# Patient Record
Sex: Female | Born: 1990 | Race: White | Hispanic: No | Marital: Single | Smoking: Never smoker
Health system: Southern US, Community
[De-identification: ages and names within clinical notes are randomized; demographics above are authoritative.]

## PROBLEM LIST (undated history)

## (undated) HISTORY — PX: APPENDECTOMY: SHX54

---

## 2019-08-31 DIAGNOSIS — M6282 Rhabdomyolysis: Secondary | ICD-10-CM

## 2019-08-31 HISTORY — DX: Rhabdomyolysis: M62.82

## 2019-09-09 ENCOUNTER — Encounter (HOSPITAL_COMMUNITY): Payer: Self-pay

## 2019-09-09 ENCOUNTER — Emergency Department (HOSPITAL_COMMUNITY)
Admission: EM | Admit: 2019-09-09 | Discharge: 2019-09-09 | Disposition: A | Discharge status: Home / Self Care | Payer: 59 | Attending: Emergency Medicine | Admitting: Emergency Medicine

## 2019-09-09 ENCOUNTER — Other Ambulatory Visit: Payer: Self-pay

## 2019-09-09 DIAGNOSIS — Z5321 Procedure and treatment not carried out due to patient leaving prior to being seen by health care provider: Secondary | ICD-10-CM | POA: Diagnosis not present

## 2019-09-09 DIAGNOSIS — R2231 Localized swelling, mass and lump, right upper limb: Secondary | ICD-10-CM | POA: Diagnosis present

## 2019-09-09 NOTE — ED Notes (Signed)
Pt returned stickers and eloped from the waiting room prior to [place] completion. VS stable and pt displays no sx of distress.

## 2019-09-09 NOTE — ED Triage Notes (Signed)
Right arm swelling for approx 12 hours. [facility] and came here to rule out blood clot. Swollen, red, and ward to touch. Pt denies pain.

## 2019-09-10 ENCOUNTER — Encounter (HOSPITAL_COMMUNITY): Payer: Self-pay | Admitting: Emergency Medicine

## 2019-09-10 ENCOUNTER — Observation Stay (HOSPITAL_COMMUNITY)
Admission: EM | Admit: 2019-09-10 | Discharge: 2019-09-11 | Disposition: A | Discharge status: Home / Self Care | Payer: 59 | Attending: Internal Medicine | Admitting: Internal Medicine

## 2019-09-10 ENCOUNTER — Emergency Department (HOSPITAL_COMMUNITY)
Admit: 2019-09-10 | Discharge: 2019-09-10 | Disposition: A | Discharge status: Home / Self Care | Payer: 59 | Attending: Emergency Medicine | Admitting: Emergency Medicine

## 2019-09-10 ENCOUNTER — Emergency Department (HOSPITAL_COMMUNITY): Payer: 59

## 2019-09-10 DIAGNOSIS — R609 Edema, unspecified: Secondary | ICD-10-CM

## 2019-09-10 DIAGNOSIS — M6282 Rhabdomyolysis: Secondary | ICD-10-CM | POA: Diagnosis not present

## 2019-09-10 DIAGNOSIS — R7401 Elevation of levels of liver transaminase levels: Secondary | ICD-10-CM | POA: Diagnosis not present

## 2019-09-10 DIAGNOSIS — Z20822 Contact with and (suspected) exposure to covid-19: Secondary | ICD-10-CM | POA: Diagnosis not present

## 2019-09-10 LAB — CBC WITH DIFFERENTIAL/PLATELET
Abs Immature Granulocytes: 0.02 10*3/uL (ref 0.00–0.07)
Basophils Absolute: 0 10*3/uL (ref 0.0–0.1)
Basophils Relative: 1 %
Eosinophils Absolute: 0.1 10*3/uL (ref 0.0–0.5)
Eosinophils Relative: 2 %
HCT: 41 % (ref 36.0–46.0)
Hemoglobin: 13 g/dL (ref 12.0–15.0)
Immature Granulocytes: 0 %
Lymphocytes Relative: 19 %
Lymphs Abs: 1.3 10*3/uL (ref 0.7–4.0)
MCH: 30 pg (ref 26.0–34.0)
MCHC: 31.7 g/dL (ref 30.0–36.0)
MCV: 94.5 fL (ref 80.0–100.0)
Monocytes Absolute: 0.4 10*3/uL (ref 0.1–1.0)
Monocytes Relative: 6 %
Neutro Abs: 4.7 10*3/uL (ref 1.7–7.7)
Neutrophils Relative %: 72 %
Platelets: 236 10*3/uL (ref 150–400)
RBC: 4.34 MIL/uL (ref 3.87–5.11)
RDW: 13.3 % (ref 11.5–15.5)
WBC: 6.6 10*3/uL (ref 4.0–10.5)
nRBC: 0 % (ref 0.0–0.2)

## 2019-09-10 LAB — I-STAT BETA HCG BLOOD, ED (NOT ORDERABLE): I-stat hCG, quantitative: <5 m[IU]/mL (ref <5)

## 2019-09-10 LAB — HIV ANTIBODY (ROUTINE TESTING W REFLEX): HIV Screen 4th Generation wRfx: NONREACTIVE

## 2019-09-10 LAB — COMPREHENSIVE METABOLIC PANEL
ALT: 96 U/L — ABNORMAL HIGH (ref 0–44)
AST: 172 U/L — ABNORMAL HIGH (ref 15–41)
Albumin: 3.3 g/dL — ABNORMAL LOW (ref 3.5–5.0)
Alkaline Phosphatase: 53 U/L (ref 38–126)
Anion gap: 10 (ref 5–15)
BUN: 11 mg/dL (ref 6–20)
CO2: 23 mmol/L (ref 22–32)
Calcium: 8.8 mg/dL — ABNORMAL LOW (ref 8.9–10.3)
Chloride: 107 mmol/L (ref 98–111)
Creatinine, Ser: 0.7 mg/dL (ref 0.44–1.00)
GFR calc Af Amer: >60 mL/min (ref >60)
GFR calc non Af Amer: >60 mL/min (ref >60)
Glucose, Bld: 94 mg/dL (ref 70–99)
Potassium: 3.7 mmol/L (ref 3.5–5.1)
Sodium: 140 mmol/L (ref 135–145)
Total Bilirubin: 0.7 mg/dL (ref 0.3–1.2)
Total Protein: 5.9 g/dL — ABNORMAL LOW (ref 6.5–8.1)

## 2019-09-10 LAB — SARS CORONAVIRUS 2 BY RT PCR (HOSPITAL ORDER, PERFORMED IN ~~LOC~~ HOSPITAL LAB): SARS Coronavirus 2: NEGATIVE

## 2019-09-10 LAB — CK: Total CK: 6125 U/L — ABNORMAL HIGH (ref 38–234)

## 2019-09-10 MED ORDER — SODIUM CHLORIDE 0.9 % IV BOLUS
1000.0000 mL | Freq: Once | INTRAVENOUS | Status: AC
  Administered 2019-09-10: 1000 mL via INTRAVENOUS

## 2019-09-10 MED ORDER — IOHEXOL 300 MG/ML  SOLN
100.0000 mL | Freq: Once | INTRAMUSCULAR | Status: AC | PRN
  Administered 2019-09-10: 100 mL via INTRAVENOUS

## 2019-09-10 MED ORDER — SODIUM CHLORIDE 0.9 % IV SOLN: INTRAVENOUS | Status: DC

## 2019-09-10 MED ORDER — ACETAMINOPHEN 325 MG PO TABS
650.0000 mg | ORAL_TABLET | Freq: Four times a day (QID) | ORAL | Status: DC | PRN
  Administered 2019-09-10: 650 mg via ORAL
  Filled 2019-09-10: qty 2

## 2019-09-10 MED ORDER — ACETAMINOPHEN 650 MG RE SUPP: 650.0000 mg | Freq: Four times a day (QID) | RECTAL | Status: DC | PRN

## 2019-09-10 NOTE — ED Triage Notes (Addendum)
Pt states she had a sudden onset of right arm swelling that began yesterday- begins in right upper arm into forearm. Pain is currently just a 1/10. +2 radial pulse. Pt states she did do an intense arm work out 1 week ago and has soreness in her arms for a few days.

## 2019-09-10 NOTE — Progress Notes (Signed)
Right upper extremity venous duplex completed. Refer to "CV Proc" under chart review to view preliminary results.  09/10/2019 8:27 AM Eula Fried., MHA, RVT, RDCS, RDMS

## 2019-09-10 NOTE — Plan of Care (Signed)

## 2019-09-10 NOTE — ED Provider Notes (Signed)
Medical screening examination/treatment/procedure(s) were conducted as a shared visit with non-physician practitioner(s) and myself.  I personally evaluated the patient during the encounter. Briefly, the patient is a 29 y.o. female with no significant medical history who presents to the ED with right arm swelling that began yesterday.  Overall unremarkable vitals.  Patient states no specific trauma.  She did do an intense arm workout about a week ago and had soreness in her arms for a few days.  Swelling started yesterday and has slowly progressed.  Denies any numbness tingling, severe pain or weakness.  She has good pulses in her right upper extremity.  Overall the compartments of the arm feels soft but there is some fullness in the forearm and bicep area and AC area.  There is some mild erythema that traces from the mid bicep down to the wrist.  No obvious crepitus.  Swelling does not go into the axillary area.  No swelling of the neck of the face.  Patient denies any IV drug use, alcohol use socially.  DVT study thus far has been performed that was negative.  Does not really appear consistent with cellulitis.  Does not appear to be an arterial process given good capillary refill, good neurovascular exam and good pulses in the right radial.  Will pursue lab work including CK.  Will get CT imaging of the chest and right upper extremity to further evaluate for soft tissue process or mass-effect.  CK elevated.  Liver enzymes mildly elevated.  CT imaging is pending but anticipate admission for further rhabdomyolysis care.  Compartments are soft at this time.  Will touch base with orthopedics to make them aware.  This chart was dictated using voice recognition software.  Despite best efforts to proofread,  errors can occur which can change the documentation meaning.     EKG Interpretation None           Virgina Norfolk, DO 09/10/19 1316

## 2019-09-10 NOTE — ED Provider Notes (Signed)
[facility] EMERGENCY DEPARTMENT Provider Note   CSN: [id number] Arrival date & time: 09/10/19  0932     History Chief Complaint  Patient presents with  . Edema    Evelyn Hill is a 29 y.o. female who presents emergency department chief complaint of right upper extremity swelling.  Patient states that she woke up yesterday with some swelling in the right arm that has progressively worsened.  She states that she tried to wait it out yesterday eventually went to an urgent care who sent her to the ER however the wait was long and she left prior to having an ultrasound of the right upper extremity.  Since then her arm has gotten progressively more swollen.  She states that it is somewhat sore but not extremely painful.  It feels tight, heavy and uncomfortable.  She has never had anything like this before.  She recently began working out and did a fairly intense arm workout on Monday.  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  She is right-hand dominant.  She denies cough, chest pain, shortness of breath.  She is not a smoker. She does not take OCPs. No injuries. No change in urine color. No bug bites. No rashes.    HPI     History reviewed. No pertinent past medical history.  Patient Active Problem List   Diagnosis Date Noted  . Rhabdomyolysis 09/10/2019    History reviewed. No pertinent surgical history.   OB History   No obstetric history on file.     No family history on file.  Social History   Tobacco Use  . Smoking status: Never Smoker  . Smokeless tobacco: Never Used  Substance Use Topics  . Alcohol use: Yes  . Drug use: Not Currently    Home Medications Prior to Admission medications   Medication Sig Start Date End Date Taking? Authorizing Provider  ibuprofen (ADVIL) 200 MG tablet Take 400 mg by mouth every 6 (six) hours as needed for headache.   Yes  [provider]    Allergies    Patient has no known allergies.  Review of Systems   Review of Systems Ten systems reviewed and are negative for acute change, except as noted in the HPI.   Physical Exam Updated Vital Signs BP 135/70 (BP Location: Right Arm)   Pulse 95   Temp 98.5 F (36.9 C) (Oral)   Resp 16   SpO2 100%   Physical Exam Vitals and nursing note reviewed.  Constitutional:      General: She is not in acute distress.    Appearance: She is well-developed. She is not diaphoretic.  HENT:     Head: Normocephalic and atraumatic.  Eyes:     General: No scleral icterus.    Conjunctiva/sclera: Conjunctivae normal.  Cardiovascular:     Rate and Rhythm: Normal rate and regular rhythm.     Heart sounds: Normal heart sounds. No murmur. No friction rub. No gallop.   Pulmonary:     Effort: Pulmonary effort is normal. No respiratory distress.     Breath sounds: Normal breath sounds.  Abdominal:     General: Bowel sounds are normal. There is no distension.     Palpations: Abdomen is soft. There is no mass.     Tenderness: There is no abdominal tenderness. There is no guarding.  Musculoskeletal:     Cervical back:  Normal range of motion.     Comments: RUE Is markedly edematous from the fingers up to the axilla. 2+ radial pulse. No telangectasia or venous dilation.  Skin:    General: Skin is warm and dry.  Neurological:     Mental Status: She is alert and oriented to person, place, and time.  Psychiatric:        Behavior: Behavior normal.     ED Results / Procedures / Treatments   Labs (all labs ordered are listed, but only abnormal results are displayed) Labs Reviewed  CK - Abnormal; Notable for the following components:      Result Value   Total CK 6,125 (*)    All other components within normal limits  COMPREHENSIVE METABOLIC PANEL - Abnormal; Notable for the following components:   Calcium 8.8 (*)    Total Protein 5.9 (*)    Albumin 3.3 (*)     AST 172 (*)    ALT 96 (*)    All other components within normal limits  SARS CORONAVIRUS 2 BY RT PCR (HOSPITAL ORDER, [place] [facility])  CBC WITH DIFFERENTIAL/PLATELET  HIV ANTIBODY (ROUTINE TESTING W REFLEX)  URINALYSIS, ROUTINE W REFLEX MICROSCOPIC  I-STAT BETA HCG BLOOD, ED ([facility], [facility], [facility] ONLY)    EKG None  Radiology DG Chest 2 View  Result Date: 09/10/2019 CLINICAL DATA:  Right upper extremity swelling. EXAM: CHEST - 2 VIEW COMPARISON:  None FINDINGS: The heart size and mediastinal contours are within normal limits. Both lungs are clear. The visualized skeletal structures are unremarkable. IMPRESSION: No active cardiopulmonary disease. Electronically Signed   By: Kerby Moors M.D.   On: 09/10/2019 08:38   CT Chest W Contrast  Result Date: 09/10/2019 CLINICAL DATA:  Right arm swelling EXAM: CT CHEST WITH CONTRAST TECHNIQUE: Multidetector CT imaging of the chest was performed during intravenous contrast administration. CONTRAST:  149mL OMNIPAQUE IOHEXOL 300 MG/ML  SOLN COMPARISON:  None. FINDINGS: Cardiovascular: Right subclavian vein and superior vena cava are not well evaluated due to contrast timing. Normal heart size. No pericardial effusion. Normal caliber aorta. Mediastinum/Nodes: No evidence of adenopathy. The visualized thyroid is unremarkable. Esophagus is unremarkable. Lungs/Pleura: No consolidation or mass. No pleural effusion pneumothorax. Upper Abdomen: No acute abnormality. Musculoskeletal: No acute abnormality. Small thoracic spine Schmorl's nodes. IMPRESSION: No acute or significant abnormality. The right subclavian vein and superior vena cava are not well evaluated due to contrast timing. Electronically Signed   By: Macy Mis M.D.   On: 09/10/2019 13:46   UE VENOUS DUPLEX ([facility] & [facility] 7 am - 7 pm)  Result Date: 09/10/2019 UPPER VENOUS STUDY  Indications: Edema, and Recent intense upper extremity exercise. Comparison Study: No prior study Performing  Technologist: Maudry Mayhew MHA, RDMS, RVT, RDCS  Examination Guidelines: A complete evaluation includes B-mode imaging, spectral Doppler, color Doppler, and power Doppler as needed of all accessible portions of each vessel. Bilateral testing is considered an integral part of a complete examination. Limited examinations for reoccurring indications may be performed as noted.  Right Findings: +----------+------------+---------+-----------+----------+-------+ RIGHT     CompressiblePhasicitySpontaneousPropertiesSummary +----------+------------+---------+-----------+----------+-------+ IJV           Full       Yes       Yes                      +----------+------------+---------+-----------+----------+-------+ Subclavian    Full       Yes       Yes                      +----------+------------+---------+-----------+----------+-------+  Axillary      Full       Yes       Yes                      +----------+------------+---------+-----------+----------+-------+ Brachial      Full       Yes       Yes                      +----------+------------+---------+-----------+----------+-------+ Radial        Full                                          +----------+------------+---------+-----------+----------+-------+ Ulnar         Full                                          +----------+------------+---------+-----------+----------+-------+ Cephalic      Full                                          +----------+------------+---------+-----------+----------+-------+ Basilic       Full                                          +----------+------------+---------+-----------+----------+-------+  Left Findings: +----------+------------+---------+-----------+----------+-------+ LEFT      CompressiblePhasicitySpontaneousPropertiesSummary +----------+------------+---------+-----------+----------+-------+ Subclavian               Yes       Yes                       +----------+------------+---------+-----------+----------+-------+  Summary:  Right: No evidence of deep vein thrombosis in the upper extremity. No evidence of superficial vein thrombosis in the upper extremity.  Left: No evidence of thrombosis in the subclavian.  *See table(s) above for measurements and observations.    Preliminary    CT Extrem Up Entire Arm R W/CM  Result Date: 09/10/2019 CLINICAL DATA:  Right upper extremity swelling. EXAM: CT OF THE UPPER RIGHT EXTREMITY WITH CONTRAST TECHNIQUE: Multidetector CT imaging of the upper right extremity was performed according to the standard protocol following intravenous contrast administration. COMPARISON:  None. CONTRAST:  OMNIPAQUE IOHEXOL 300 MG/ML  SOLN FINDINGS: Bones/Joint/Cartilage No fracture or dislocation. Normal alignment. No joint effusion. No periosteal reaction or bone destruction. Ligaments Ligaments are suboptimally evaluated by CT. Muscles and Tendons Muscles are normal. No intramuscular fluid collection or hematoma. Soft tissue No fluid collection or hematoma. No soft tissue mass. Generalized soft tissue edema in the subcutaneous fat of right arm which may be secondary to lymphedema or cellulitis. IMPRESSION: Generalized soft tissue edema in the subcutaneous fat of the right arm which may be secondary to lymphedema or cellulitis. No focal fluid collection to suggest an abscess. Electronically Signed   By: Elige Ko   On: 09/10/2019 14:39    Procedures Procedures (including critical care time)  Medications Ordered in ED Medications  sodium chloride 0.9 % bolus 1,000 mL (has no administration in time range)  0.9 %  sodium chloride infusion (has no administration  in time range)  acetaminophen (TYLENOL) tablet 650 mg (has no administration in time range)    Or  acetaminophen (TYLENOL) suppository 650 mg (has no administration in time range)  iohexol (OMNIPAQUE) 300 MG/ML solution 100 mL (100 mLs Intravenous Contrast  Given 09/10/19 1308)    ED Course  I have reviewed the triage vital signs and the nursing notes.  Pertinent labs & imaging results that were available during my care of the patient were reviewed by me and considered in my medical decision making (see chart for details).  Clinical Course as of Sep 09 1513  Tue Sep 10, 2019  4825 Patient's upper extremity duplex is negative.  Doubt arterial issue as the patient has bounding 2+ pulses without weakness, paresthesia or pain.  Cap refill less than 2 seconds. We will proceed with CT chest and right upper extremity CT to rule out soft tissue infection versus compressive disorder in the chest.   [AH]  1052 ALT(!): 96 [AH]  1053 AST(!): 172 [AH]  1053 Albumin(!): 3.3 [AH]    Clinical Course User Index [AH] Algenis Ballin, PA-C   MDM Rules/Calculators/A&P                      8:08 AM BP 135/70 (BP Location: Right Arm)   Pulse 95   Resp 16   SpO2 100%  Patient with RUE UL swelling.  DDX includes RUE DVT (padget-schroeder/ effort thrombosis, Thoracic outlet syndrome)  Rhabdomyolysis, lymph edema,  Axillary adenopathy, mediastinal mass).  Subclavian steal syndrome   29 year old female who presents the emergency department with chief complaint of right upper extremity swelling.  Differential remains the same.  I personally ordered interpreted reviewed patient's labs which shows a CMP with elevated liver enzymes, mildly low calcium of insignificant value.  Urine pending.  Covid test pending.  CBC without abnormality.  CK elevated at 6125. I consulted with Dr. Susa Simmonds of orthopedics.  He states that he has not heard of a delayed type of rhabdomyolysis however she does appear to have this.  She does not have any evidence of compartment syndrome.  She has normal pulses and sensation.  The patient will be admitted to the hospitalist service for fluids.  I personally reviewed the patient's CT chest which shows no evidence of mass, or SVC syndrome.  There  is no evidence of DVT on the ultrasound.  CT chest is pending.  Patient stable throughout her visit in the emergency department.   Final Clinical Impression(s) / ED Diagnoses Final diagnoses:  Non-traumatic rhabdomyolysis    Rx / DC Orders ED Discharge Orders    None       Arthor Captain, PA-C 09/10/19 1515    Virgina Norfolk, DO 09/13/19 1631

## 2019-09-10 NOTE — ED Notes (Signed)
MS  Dinner ordered 

## 2019-09-10 NOTE — H&P (Signed)
History and Physical    ZO LOUDON XTG:[id number] DOB: November 23, 1990 DOA: 09/10/2019  PCP: Patient, No Pcp Per   Patient coming from: Home  I have personally briefly reviewed patient's old medical records in [facility]  Chief Complaint:Right arm pain, swelling  HPI: Evelyn Hill is a 29 y.o. female with no significant medical history presented with right arm pain and swelling.  Patient went to team to of arm strengths work-up strenuously weightlifting for problem lower 7 days ago.  She went home and next day she started to feel right arm pain and soreness, and over the 7 days she continued to feel the arm pain and heaviness, and yesterday she noticed significant sudden onset of right upper arm swelling to the elbow.  Having trouble to flex the elbow and very heavy feeling on the right upper arm 2.  Patient denied any fever or chills, no numbness, no weakness of the right hand or fingers. ED Course: CK elevation to 6000, right upper arm DVT study negative, CT angiogram negative for PE.  Review of Systems: As per HPI otherwise 10 point review of systems negative.    History reviewed. No pertinent past medical history.  History reviewed. No pertinent surgical history.   reports that she has never smoked. She has never used smokeless tobacco. She reports current alcohol use. She reports previous drug use.  No Known Allergies  No family history on file.    Prior to Admission medications   Medication Sig Start Date End Date Taking? Authorizing Provider  ibuprofen (ADVIL) 200 MG tablet Take 400 mg by mouth every 6 (six) hours as needed for headache.   Yes [provider]    Physical Exam: Vitals:   09/10/19 0633 09/10/19 1500  BP: 135/70   Pulse: 95   Resp: 16   Temp:  98.5 F (36.9 C)  TempSrc:  Oral  SpO2: 100%     Constitutional: NAD, calm, comfortable Vitals:   09/10/19 0633 09/10/19 1500  BP: 135/70   Pulse: 95   Resp: 16   Temp:  98.5  F (36.9 C)  TempSrc:  Oral  SpO2: 100%    Eyes: PERRL, lids and conjunctivae normal ENMT: Mucous membranes are moist. Posterior pharynx clear of any exudate or lesions.Normal dentition.  Neck: normal, supple, no masses, no thyromegaly Respiratory: clear to auscultation bilaterally, no wheezing, no crackles. Normal respiratory effort. No accessory muscle use.  Cardiovascular: Regular rate and rhythm, no murmurs / rubs / gallops. No extremity edema. 2+ pedal pulses. No carotid bruits.  Bilateral radial pulses equal, capillary refill within normal limits Abdomen: no tenderness, no masses palpated. No hepatosplenomegaly. Bowel sounds positive.  Musculoskeletal: no clubbing / cyanosis. No joint deformity upper and lower extremities. Good ROM, no contractures. Normal muscle tone.  Significant swelling over the right upper arm, mild tenderness. Skin: no rashes, lesions, ulcers. No induration Neurologic: CN 2-12 grossly intact. Sensation intact, DTR normal. Strength 5/5 in all 4.  Psychiatric: Normal judgment and insight. Alert and oriented x 3. Normal mood.     Labs on Admission: I have personally reviewed following labs and imaging studies  CBC: Recent Labs  Lab 09/10/19 0911  WBC 6.6  NEUTROABS 4.7  HGB 13.0  HCT 41.0  MCV 94.5  PLT 236   Basic Metabolic Panel: Recent Labs  Lab 09/10/19 0911  NA 140  K 3.7  CL 107  CO2 23  GLUCOSE 94  BUN 11  CREATININE 0.70  CALCIUM 8.8*  GFR: Estimated Creatinine Clearance: 101.8 mL/min (by C-G formula based on SCr of 0.7 mg/dL). Liver Function Tests: Recent Labs  Lab 09/10/19 0911  AST 172*  ALT 96*  ALKPHOS 53  BILITOT 0.7  PROT 5.9*  ALBUMIN 3.3*   No results for input(s): LIPASE, AMYLASE in the last 168 hours. No results for input(s): AMMONIA in the last 168 hours. Coagulation Profile: No results for input(s): INR, PROTIME in the last 168 hours. Cardiac Enzymes: Recent Labs  Lab 09/10/19 0911  CKTOTAL 6,125*   BNP  (last 3 results) No results for input(s): PROBNP in the last 8760 hours. HbA1C: No results for input(s): HGBA1C in the last 72 hours. CBG: No results for input(s): GLUCAP in the last 168 hours. Lipid Profile: No results for input(s): CHOL, HDL, LDLCALC, TRIG, CHOLHDL, LDLDIRECT in the last 72 hours. Thyroid Function Tests: No results for input(s): TSH, T4TOTAL, FREET4, T3FREE, THYROIDAB in the last 72 hours. Anemia Panel: No results for input(s): VITAMINB12, FOLATE, FERRITIN, TIBC, IRON, RETICCTPCT in the last 72 hours. Urine analysis: No results found for: COLORURINE, APPEARANCEUR, LABSPEC, PHURINE, GLUCOSEU, HGBUR, BILIRUBINUR, KETONESUR, PROTEINUR, UROBILINOGEN, NITRITE, LEUKOCYTESUR  Radiological Exams on Admission: DG Chest 2 View  Result Date: 09/10/2019 CLINICAL DATA:  Right upper extremity swelling. EXAM: CHEST - 2 VIEW COMPARISON:  None FINDINGS: The heart size and mediastinal contours are within normal limits. Both lungs are clear. The visualized skeletal structures are unremarkable. IMPRESSION: No active cardiopulmonary disease. Electronically Signed   By: Signa Kell M.D.   On: 09/10/2019 08:38   CT Chest W Contrast  Result Date: 09/10/2019 CLINICAL DATA:  Right arm swelling EXAM: CT CHEST WITH CONTRAST TECHNIQUE: Multidetector CT imaging of the chest was performed during intravenous contrast administration. CONTRAST:  OMNIPAQUE IOHEXOL 300 MG/ML  SOLN COMPARISON:  None. FINDINGS: Cardiovascular: Right subclavian vein and superior vena cava are not well evaluated due to contrast timing. Normal heart size. No pericardial effusion. Normal caliber aorta. Mediastinum/Nodes: No evidence of adenopathy. The visualized thyroid is unremarkable. Esophagus is unremarkable. Lungs/Pleura: No consolidation or mass. No pleural effusion pneumothorax. Upper Abdomen: No acute abnormality. Musculoskeletal: No acute abnormality. Small thoracic spine Schmorl's nodes. IMPRESSION: No acute or  significant abnormality. The right subclavian vein and superior vena cava are not well evaluated due to contrast timing. Electronically Signed   By: Guadlupe Spanish M.D.   On: 09/10/2019 13:46   UE VENOUS DUPLEX (MC & WL 7 am - 7 pm)  Result Date: 09/10/2019 UPPER VENOUS STUDY  Indications: Edema, and Recent intense upper extremity exercise. Comparison Study: No prior study Performing Technologist: Gertie Fey MHA, RDMS, RVT, RDCS  Examination Guidelines: A complete evaluation includes B-mode imaging, spectral Doppler, color Doppler, and power Doppler as needed of all accessible portions of each vessel. Bilateral testing is considered an integral part of a complete examination. Limited examinations for reoccurring indications may be performed as noted.  Right Findings: +----------+------------+---------+-----------+----------+-------+ RIGHT     CompressiblePhasicitySpontaneousPropertiesSummary +----------+------------+---------+-----------+----------+-------+ IJV           Full       Yes       Yes                      +----------+------------+---------+-----------+----------+-------+ Subclavian    Full       Yes       Yes                      +----------+------------+---------+-----------+----------+-------+ Axillary  Full       Yes       Yes                      +----------+------------+---------+-----------+----------+-------+ Brachial      Full       Yes       Yes                      +----------+------------+---------+-----------+----------+-------+ Radial        Full                                          +----------+------------+---------+-----------+----------+-------+ Ulnar         Full                                          +----------+------------+---------+-----------+----------+-------+ Cephalic      Full                                          +----------+------------+---------+-----------+----------+-------+ Basilic       Full                                           +----------+------------+---------+-----------+----------+-------+  Left Findings: +----------+------------+---------+-----------+----------+-------+ LEFT      CompressiblePhasicitySpontaneousPropertiesSummary +----------+------------+---------+-----------+----------+-------+ Subclavian               Yes       Yes                      +----------+------------+---------+-----------+----------+-------+  Summary:  Right: No evidence of deep vein thrombosis in the upper extremity. No evidence of superficial vein thrombosis in the upper extremity.  Left: No evidence of thrombosis in the subclavian.  *See table(s) above for measurements and observations.    Preliminary    CT Extrem Up Entire Arm R W/CM  Result Date: 09/10/2019 CLINICAL DATA:  Right upper extremity swelling. EXAM: CT OF THE UPPER RIGHT EXTREMITY WITH CONTRAST TECHNIQUE: Multidetector CT imaging of the upper right extremity was performed according to the standard protocol following intravenous contrast administration. COMPARISON:  None. CONTRAST:  OMNIPAQUE IOHEXOL 300 MG/ML  SOLN FINDINGS: Bones/Joint/Cartilage No fracture or dislocation. Normal alignment. No joint effusion. No periosteal reaction or bone destruction. Ligaments Ligaments are suboptimally evaluated by CT. Muscles and Tendons Muscles are normal. No intramuscular fluid collection or hematoma. Soft tissue No fluid collection or hematoma. No soft tissue mass. Generalized soft tissue edema in the subcutaneous fat of right arm which may be secondary to lymphedema or cellulitis. IMPRESSION: Generalized soft tissue edema in the subcutaneous fat of the right arm which may be secondary to lymphedema or cellulitis. No focal fluid collection to suggest an abscess. Electronically Signed   By: Elige Ko   On: 09/10/2019 14:39    EKG: None  Assessment/Plan Active Problems:   Rhabdomyolysis  Acute/subacute  rhabdomyolysis -Likely from repeated intense muscle contraction-1 week ago.  Check UA for myoglobin, aggressive hydration -Her neuro exam intact, and terminal vascular condition within normal limits, as of now  there is no central signs of compartment syndrome on right arm. -Muscle strength of the right arm seems intact, but suspect there might be underlying muscle damage, orthopedic surgeon will evaluate patient today.  Consider MRI in a.m. if symptoms persist or CK trending up.  Given the significant correlation of the event related to onset of her symptoms, not considering myositis at this time.  Transaminitis Related to rhabdo, repeat LFT tomorrow    DVT prophylaxis: SCD and ambulation Code Status: Full code Family Communication: None at bedside Disposition Plan: Likely will need 1 to 2 days hospital stay for aggressive hydration and probably can safely discharge patient once CK dropped to less than 5-10 times of upper limit of normal level. Consults called: Ortho Admission status: Med Surg with 12 hour Tele from admission   Lequita Halt MD [facility] Pager [phone]    09/10/2019, 3:34 PM

## 2019-09-11 ENCOUNTER — Encounter (HOSPITAL_COMMUNITY): Payer: Self-pay | Admitting: Internal Medicine

## 2019-09-11 DIAGNOSIS — M6282 Rhabdomyolysis: Secondary | ICD-10-CM | POA: Diagnosis not present

## 2019-09-11 LAB — COMPREHENSIVE METABOLIC PANEL
ALT: 78 U/L — ABNORMAL HIGH (ref 0–44)
AST: 118 U/L — ABNORMAL HIGH (ref 15–41)
Albumin: 2.9 g/dL — ABNORMAL LOW (ref 3.5–5.0)
Alkaline Phosphatase: 44 U/L (ref 38–126)
Anion gap: 9 (ref 5–15)
BUN: 8 mg/dL (ref 6–20)
CO2: 23 mmol/L (ref 22–32)
Calcium: 8.3 mg/dL — ABNORMAL LOW (ref 8.9–10.3)
Chloride: 108 mmol/L (ref 98–111)
Creatinine, Ser: 0.64 mg/dL (ref 0.44–1.00)
GFR calc Af Amer: >60 mL/min (ref >60)
GFR calc non Af Amer: >60 mL/min (ref >60)
Glucose, Bld: 102 mg/dL — ABNORMAL HIGH (ref 70–99)
Potassium: 3.5 mmol/L (ref 3.5–5.1)
Sodium: 140 mmol/L (ref 135–145)
Total Bilirubin: 0.7 mg/dL (ref 0.3–1.2)
Total Protein: 5.4 g/dL — ABNORMAL LOW (ref 6.5–8.1)

## 2019-09-11 LAB — CK: Total CK: 3666 U/L — ABNORMAL HIGH (ref 38–234)

## 2019-09-11 NOTE — Discharge Summary (Signed)
Physician Discharge Summary  Evelyn Hill DZH:[id number] DOB: 12-03-1990 DOA: 09/10/2019  PCP: Patient, No Pcp Per  Admit date: 09/10/2019 Discharge date: 09/11/2019  Admitted From: Home Disposition: Home  Recommendations for Outpatient Follow-up:  1. Follow up with PCP in 1-2 weeks 2. Please obtain BMP/CBC in one week your next doctors visit.  3. Advise oral hydration at home.   Discharge Condition: Stable CODE STATUS: Full code Diet recommendation: Regular  Brief/Interim Summary: 29 year old with no known past medical history presented with right arm swelling and right arm pain after weight lifting about a week ago.  Right upper extremity negative for DVT, CT angio negative for PE.  CK elevated initially at 6000 found to be in rhabdomyolysis.  With IV fluids this trended down to about 3000 range.  Urine remained clear with stable renal function.  Patient wanted to go home today as she is able to tolerate p.o. intake and ensures she will orally hydrate herself. Stable for discharge.   Assessment & Plan:   Active Problems:   Rhabdomyolysis    Severe acute rhabdomyolysis, greatly improved. -No evidence of hemodynamic compromise at the time.  No signs of compartment syndrome.  CK is trending down, continue IV fluids. CTA chest negative for PE CK level has trended down from 6000 to about 3000.  Asymptomatic no complaints Upper extremity ultrasound negative for DVT  Transaminitis, in setting of rhabdomyolysis Improving, continue to monitor.   Discharge Diagnoses:  Active Problems:   Rhabdomyolysis    Consultations:  None  Subjective: Feels great wants to go home.  Discharge Exam: Vitals:   09/11/19 0401 09/11/19 0825  BP: (!) 136/57 (!) 144/69  Pulse: 75 80  Resp: 17 16  Temp: 98.4 F (36.9 C) 98 F (36.7 C)  SpO2: 97% 100%   Vitals:   09/10/19 1840 09/10/19 2034 09/11/19 0401 09/11/19 0825  BP: 125/71 (!) 145/67 (!) 136/57 (!) 144/69  Pulse:  82 75 75 80  Resp: 17 17 17 16   Temp: 98.7 F (37.1 C) 97.6 F (36.4 C) 98.4 F (36.9 C) 98 F (36.7 C)  TempSrc: Oral Oral Oral Oral  SpO2: 100% 98% 97% 100%    General: Pt is alert, awake, not in acute distress Cardiovascular: RRR, S1/S2 +, no rubs, no gallops Respiratory: CTA bilaterally, no wheezing, no rhonchi Abdominal: Soft, NT, ND, bowel sounds + Extremities: no edema, no cyanosis  Discharge Instructions   Allergies as of 09/11/2019   No Known Allergies     Medication List    STOP taking these medications   ibuprofen 200 MG tablet Commonly known as: ADVIL       No Known Allergies  You were cared for by a hospitalist during your hospital stay. If you have any questions about your discharge medications or the care you received while you were in the hospital after you are discharged, you can call the unit and asked to speak with the hospitalist on call if the hospitalist that took care of you is not available. Once you are discharged, your primary care physician will handle any further medical issues. Please note that no refills for any discharge medications will be authorized once you are discharged, as it is imperative that you return to your primary care physician (or establish a relationship with a primary care physician if you do not have one) for your aftercare needs so that they can reassess your need for medications and monitor your lab values.   Procedures/Studies: DG Chest 2 View  Result Date: 09/10/2019 CLINICAL DATA:  Right upper extremity swelling. EXAM: CHEST - 2 VIEW COMPARISON:  None FINDINGS: The heart size and mediastinal contours are within normal limits. Both lungs are clear. The visualized skeletal structures are unremarkable. IMPRESSION: No active cardiopulmonary disease. Electronically Signed   By: Signa Kell M.D.   On: 09/10/2019 08:38   CT Chest W Contrast  Result Date: 09/10/2019 CLINICAL DATA:  Right arm swelling EXAM: CT CHEST WITH  CONTRAST TECHNIQUE: Multidetector CT imaging of the chest was performed during intravenous contrast administration. CONTRAST:  OMNIPAQUE IOHEXOL 300 MG/ML  SOLN COMPARISON:  None. FINDINGS: Cardiovascular: Right subclavian vein and superior vena cava are not well evaluated due to contrast timing. Normal heart size. No pericardial effusion. Normal caliber aorta. Mediastinum/Nodes: No evidence of adenopathy. The visualized thyroid is unremarkable. Esophagus is unremarkable. Lungs/Pleura: No consolidation or mass. No pleural effusion pneumothorax. Upper Abdomen: No acute abnormality. Musculoskeletal: No acute abnormality. Small thoracic spine Schmorl's nodes. IMPRESSION: No acute or significant abnormality. The right subclavian vein and superior vena cava are not well evaluated due to contrast timing. Electronically Signed   By: Guadlupe Spanish M.D.   On: 09/10/2019 13:46   UE VENOUS DUPLEX ([facility] & [facility] 7 am - 7 pm)  Result Date: 09/10/2019 UPPER VENOUS STUDY  Indications: Edema, and Recent intense upper extremity exercise. Comparison Study: No prior study Performing Technologist: Gertie Fey MHA, RDMS, RVT, RDCS  Examination Guidelines: A complete evaluation includes B-mode imaging, spectral Doppler, color Doppler, and power Doppler as needed of all accessible portions of each vessel. Bilateral testing is considered an integral part of a complete examination. Limited examinations for reoccurring indications may be performed as noted.  Right Findings: +----------+------------+---------+-----------+----------+-------+ RIGHT     CompressiblePhasicitySpontaneousPropertiesSummary +----------+------------+---------+-----------+----------+-------+ IJV           Full       Yes       Yes                      +----------+------------+---------+-----------+----------+-------+ Subclavian    Full       Yes       Yes                       +----------+------------+---------+-----------+----------+-------+ Axillary      Full       Yes       Yes                      +----------+------------+---------+-----------+----------+-------+ Brachial      Full       Yes       Yes                      +----------+------------+---------+-----------+----------+-------+ Radial        Full                                          +----------+------------+---------+-----------+----------+-------+ Ulnar         Full                                          +----------+------------+---------+-----------+----------+-------+ Cephalic      Full                                          +----------+------------+---------+-----------+----------+-------+  Basilic       Full                                          +----------+------------+---------+-----------+----------+-------+  Left Findings: +----------+------------+---------+-----------+----------+-------+ LEFT      CompressiblePhasicitySpontaneousPropertiesSummary +----------+------------+---------+-----------+----------+-------+ Subclavian               Yes       Yes                      +----------+------------+---------+-----------+----------+-------+  Summary:  Right: No evidence of deep vein thrombosis in the upper extremity. No evidence of superficial vein thrombosis in the upper extremity.  Left: No evidence of thrombosis in the subclavian.  *See table(s) above for measurements and observations.  Diagnosing physician: Harold Barban MD Electronically signed by Harold Barban MD on 09/10/2019 at 9:47:13 PM.    Final    CT Extrem Up Entire Arm R W/CM  Result Date: 09/10/2019 CLINICAL DATA:  Right upper extremity swelling. EXAM: CT OF THE UPPER RIGHT EXTREMITY WITH CONTRAST TECHNIQUE: Multidetector CT imaging of the upper right extremity was performed according to the standard protocol following intravenous contrast administration. COMPARISON:  None. CONTRAST:   148mL OMNIPAQUE IOHEXOL 300 MG/ML  SOLN FINDINGS: Bones/Joint/Cartilage No fracture or dislocation. Normal alignment. No joint effusion. No periosteal reaction or bone destruction. Ligaments Ligaments are suboptimally evaluated by CT. Muscles and Tendons Muscles are normal. No intramuscular fluid collection or hematoma. Soft tissue No fluid collection or hematoma. No soft tissue mass. Generalized soft tissue edema in the subcutaneous fat of right arm which may be secondary to lymphedema or cellulitis. IMPRESSION: Generalized soft tissue edema in the subcutaneous fat of the right arm which may be secondary to lymphedema or cellulitis. No focal fluid collection to suggest an abscess. Electronically Signed   By: Kathreen Devoid   On: 09/10/2019 14:39      The results of significant diagnostics from this hospitalization (including imaging, microbiology, ancillary and laboratory) are listed below for reference.     Microbiology: Recent Results (from the past 240 hour(s))  SARS Coronavirus 2 by RT PCR (hospital order, performed in [facility] hospital lab) Nasopharyngeal Nasopharyngeal Swab     Status: None   Collection Time: 09/10/19  3:53 PM   Specimen: Nasopharyngeal Swab  Result Value Ref Range Status   SARS Coronavirus 2 NEGATIVE NEGATIVE Final    Comment: (NOTE) SARS-CoV-2 target nucleic acids are NOT DETECTED. The SARS-CoV-2 RNA is generally detectable in upper and lower respiratory specimens during the acute phase of infection. The lowest concentration of SARS-CoV-2 viral copies this assay can detect is 250 copies / mL. A negative result does not preclude SARS-CoV-2 infection and should not be used as the sole basis for treatment or other patient management decisions.  A negative result may occur with improper specimen collection / handling, submission of specimen other than nasopharyngeal swab, presence of viral mutation(s) within the areas targeted by this assay, and inadequate number of  viral copies (<250 copies / mL). A negative result must be combined with clinical observations, patient history, and epidemiological information. Fact Sheet for Patients:   StrictlyIdeas.no Fact Sheet for Healthcare Providers: BankingDealers.co.za This test is not yet approved or cleared  by the [place] FDA and has been authorized for detection and/or diagnosis of SARS-CoV-2 by FDA under an Emergency Use Authorization (EUA).  This EUA  will remain in effect (meaning this test can be used) for the duration of the COVID-19 declaration under Section 564(b)(1) of the Act, 21 U.S.C. section 360bbb-3(b)(1), unless the authorization is terminated or revoked sooner. Performed at [facility], [address] [place], [place] [address]      Labs: BNP (last 3 results) No results for input(s): BNP in the last 8760 hours. Basic Metabolic Panel: Recent Labs  Lab 09/10/19 0911 09/11/19 0340  NA 140 140  K 3.7 3.5  CL 107 108  CO2 23 23  GLUCOSE 94 102*  BUN 11 8  CREATININE 0.70 0.64  CALCIUM 8.8* 8.3*   Liver Function Tests: Recent Labs  Lab 09/10/19 0911 09/11/19 0340  AST 172* 118*  ALT 96* 78*  ALKPHOS 53 44  BILITOT 0.7 0.7  PROT 5.9* 5.4*  ALBUMIN 3.3* 2.9*   No results for input(s): LIPASE, AMYLASE in the last 168 hours. No results for input(s): AMMONIA in the last 168 hours. CBC: Recent Labs  Lab 09/10/19 0911  WBC 6.6  NEUTROABS 4.7  HGB 13.0  HCT 41.0  MCV 94.5  PLT 236   Cardiac Enzymes: Recent Labs  Lab 09/10/19 0911 09/11/19 0340  CKTOTAL 6,125* 3,666*   BNP: Invalid input(s): POCBNP CBG: No results for input(s): GLUCAP in the last 168 hours. D-Dimer No results for input(s): DDIMER in the last 72 hours. Hgb A1c No results for input(s): HGBA1C in the last 72 hours. Lipid Profile No results for input(s): CHOL, HDL, LDLCALC, TRIG, CHOLHDL, LDLDIRECT in the last 72 hours. Thyroid function  studies No results for input(s): TSH, T4TOTAL, T3FREE, THYROIDAB in the last 72 hours.  Invalid input(s): FREET3 Anemia work up No results for input(s): VITAMINB12, FOLATE, FERRITIN, TIBC, IRON, RETICCTPCT in the last 72 hours. Urinalysis No results found for: COLORURINE, APPEARANCEUR, LABSPEC, PHURINE, GLUCOSEU, HGBUR, BILIRUBINUR, KETONESUR, PROTEINUR, UROBILINOGEN, NITRITE, LEUKOCYTESUR Sepsis Labs Invalid input(s): PROCALCITONIN,  WBC,  LACTICIDVEN Microbiology Recent Results (from the past 240 hour(s))  SARS Coronavirus 2 by RT PCR (hospital order, performed in [facility] hospital lab) Nasopharyngeal Nasopharyngeal Swab     Status: None   Collection Time: 09/10/19  3:53 PM   Specimen: Nasopharyngeal Swab  Result Value Ref Range Status   SARS Coronavirus 2 NEGATIVE NEGATIVE Final    Comment: (NOTE) SARS-CoV-2 target nucleic acids are NOT DETECTED. The SARS-CoV-2 RNA is generally detectable in upper and lower respiratory specimens during the acute phase of infection. The lowest concentration of SARS-CoV-2 viral copies this assay can detect is 250 copies / mL. A negative result does not preclude SARS-CoV-2 infection and should not be used as the sole basis for treatment or other patient management decisions.  A negative result may occur with improper specimen collection / handling, submission of specimen other than nasopharyngeal swab, presence of viral mutation(s) within the areas targeted by this assay, and inadequate number of viral copies (<250 copies / mL). A negative result must be combined with clinical observations, patient history, and epidemiological information. Fact Sheet for Patients:   BoilerBrush.com.cyhttps://www.fda.gov/media/136312/download Fact Sheet for Healthcare Providers: https://pope.com/https://www.fda.gov/media/136313/download This test is not yet approved or cleared  by the [place] FDA and has been authorized for detection and/or diagnosis of SARS-CoV-2 by FDA under an Emergency  Use Authorization (EUA).  This EUA will remain in effect (meaning this test can be used) for the duration of the COVID-19 declaration under Section 564(b)(1) of the Act, 21 U.S.C. section 360bbb-3(b)(1), unless the authorization is terminated or revoked  sooner. Performed at [facility], [address], [place], [place] [address]      Time coordinating discharge:  I have spent 35 minutes face to face with the patient and on the ward discussing the patients care, assessment, plan and disposition with other care givers. >50% of the time was devoted counseling the patient about the risks and benefits of treatment/Discharge disposition and coordinating care.   SIGNED:   Dimple Nanas, MD  [facility] 09/11/2019, 11:49 AM   If 7PM-7AM, please contact night-coverage

## 2021-01-28 ENCOUNTER — Encounter: Payer: 59 | Admitting: Family Medicine

## 2021-02-22 ENCOUNTER — Encounter: Payer: 59 | Admitting: Obstetrics & Gynecology

## 2021-04-06 ENCOUNTER — Ambulatory Visit: Payer: 59 | Admitting: Advanced Practice Midwife

## 2021-04-06 ENCOUNTER — Other Ambulatory Visit: Payer: Self-pay

## 2021-04-06 ENCOUNTER — Telehealth: Payer: Self-pay

## 2021-04-06 ENCOUNTER — Encounter: Payer: Self-pay | Admitting: Advanced Practice Midwife

## 2021-04-06 ENCOUNTER — Other Ambulatory Visit (HOSPITAL_COMMUNITY)
Admission: RE | Admit: 2021-04-06 | Discharge: 2021-04-06 | Disposition: A | Discharge status: Home / Self Care | Payer: 59 | Source: Ambulatory Visit | Attending: Obstetrics & Gynecology | Admitting: Obstetrics & Gynecology

## 2021-04-06 VITALS — BP 141/65 | HR 83 | Ht 64.0 in | Wt 167.0 lb

## 2021-04-06 DIAGNOSIS — Z0000 Encounter for general adult medical examination without abnormal findings: Secondary | ICD-10-CM

## 2021-04-06 DIAGNOSIS — Z01419 Encounter for gynecological examination (general) (routine) without abnormal findings: Secondary | ICD-10-CM | POA: Diagnosis not present

## 2021-04-06 NOTE — Telephone Encounter (Signed)
Called to let patient know her lab orders have been put in as "future"   Evelyn Hill, CMA  04/06/21

## 2021-04-06 NOTE — Progress Notes (Signed)
GYNECOLOGY ANNUAL PREVENTATIVE CARE ENCOUNTER NOTE  Subjective:   Evelyn Hill is a 30 y.o. No obstetric history on file. female here for a routine annual gynecologic exam.  Current complaints: none.   Denies abnormal vaginal bleeding, discharge, pelvic pain, problems with intercourse or other gynecologic concerns.    Gynecologic History No LMP recorded. (Menstrual status: IUD). Contraception: IUD Mirena placed 2018 (due for removal 2026 per newest guidelines) Last Pap: approx 2017. Results were: normal Last mammogram: NA. Results were: NA  Obstetric History OB History  No obstetric history on file.    Past Medical History:  Diagnosis Date   Rhabdomyolysis 08/2019    Past Surgical History:  Procedure Laterality Date   APPENDECTOMY    2010  No current outpatient medications on file prior to visit.   No current facility-administered medications on file prior to visit.    No Known Allergies PharmD  Social History   Socioeconomic History   Marital status: Single    Spouse name: Not on file   Number of children: Not on file   Years of education: Not on file   Highest education level: Not on file  Occupational History   Not on file  Tobacco Use   Smoking status: Never   Smokeless tobacco: Never  Vaping Use   Vaping Use: Never used  Substance and Sexual Activity   Alcohol use: Yes   Drug use: Not Currently   Sexual activity: Not Currently    Birth control/protection: I.U.D.  Other Topics Concern   Not on file  Social History Narrative   Not on file   Social Determinants of Health   Financial Resource Strain: Not on file  Food Insecurity: Not on file  Transportation Needs: Not on file  Physical Activity: Not on file  Stress: Not on file  Social Connections: Not on file  Intimate Partner Violence: Not on file    History reviewed. No pertinent family history.  The following portions of the patient's history were reviewed and updated as appropriate:  allergies, current medications, past family history, past medical history, past social history, past surgical history and problem list.  Review of Systems Pertinent items noted in HPI and remainder of comprehensive ROS otherwise negative.   Objective:  BP (!) 141/65   Pulse 83   Ht 5\' 4"  (1.626 m)   Wt 75.8 kg   BMI 28.67 kg/m  CONSTITUTIONAL: Well-developed, well-nourished female in no acute distress.  HENT:  Normocephalic, atraumatic, External right and left ear normal. Oropharynx is clear and moist EYES: Conjunctivae and EOM are normal. Pupils are equal, round, and reactive to light. No scleral icterus.  NECK: Normal range of motion, supple, no masses.  Normal thyroid.  SKIN: Skin is warm and dry. No rash noted. Not diaphoretic. No erythema. No pallor. NEUROLOGIC: Alert and oriented to person, place, and time. Normal reflexes, muscle tone coordination. No cranial nerve deficit noted. PSYCHIATRIC: Normal mood and affect. Normal behavior. Normal judgment and thought content. CARDIOVASCULAR: Normal heart rate noted, regular rhythm RESPIRATORY: Clear to auscultation bilaterally. Effort and breath sounds normal, no problems with respiration noted. BREASTS: Symmetric in size. No masses, skin changes, nipple drainage, or lymphadenopathy. ABDOMEN: Soft, normal bowel sounds, no distention noted.  No tenderness, rebound or guarding.  PELVIC: Normal appearing external genitalia; normal appearing vaginal mucosa and cervix.  No abnormal discharge noted.  Pap smear obtained.  Normal uterine size, no other palpable masses, no uterine or adnexal tenderness. MUSCULOSKELETAL: Normal range of motion. No  tenderness.  No cyanosis, clubbing, or edema.  2+ distal pulses.   Assessment and Plan:  1. Annual physical exam - CBC - CMET - Lipid panel - Thyroid panel  2. Gynecologic exam normal - Cytology - PAP( [place])  Will follow up results of pap smear and manage accordingly. Routine preventative  health maintenance measures emphasized. Please refer to After Visit Summary for other counseling recommendations.    Thressa Sheller DNP, CNM  04/06/21  8:58 AM

## 2021-04-08 LAB — CYTOLOGY - PAP
Comment: NEGATIVE
Diagnosis: NEGATIVE
High risk HPV: NEGATIVE

## 2021-04-09 ENCOUNTER — Other Ambulatory Visit: Payer: 59

## 2021-04-09 ENCOUNTER — Other Ambulatory Visit: Payer: Self-pay

## 2021-04-09 DIAGNOSIS — Z0000 Encounter for general adult medical examination without abnormal findings: Secondary | ICD-10-CM

## 2021-04-10 LAB — COMPREHENSIVE METABOLIC PANEL
ALT: 12 IU/L (ref 0–32)
AST: 20 IU/L (ref 0–40)
Albumin/Globulin Ratio: 2.1 (ref 1.2–2.2)
Albumin: 4.6 g/dL (ref 3.8–4.8)
Alkaline Phosphatase: 68 IU/L (ref 44–121)
BUN/Creatinine Ratio: 13 (ref 9–23)
BUN: 11 mg/dL (ref 6–20)
Bilirubin Total: 0.4 mg/dL (ref 0.0–1.2)
CO2: 23 mmol/L (ref 20–29)
Calcium: 9.7 mg/dL (ref 8.7–10.2)
Chloride: 103 mmol/L (ref 96–106)
Creatinine, Ser: 0.85 mg/dL (ref 0.57–1.00)
Globulin, Total: 2.2 g/dL (ref 1.5–4.5)
Glucose: 87 mg/dL (ref 70–99)
Potassium: 4.6 mmol/L (ref 3.5–5.2)
Sodium: 140 mmol/L (ref 134–144)
Total Protein: 6.8 g/dL (ref 6.0–8.5)
eGFR: 93 mL/min/{1.73_m2} (ref >59)

## 2021-04-10 LAB — CBC
Hematocrit: 42.1 % (ref 34.0–46.6)
Hemoglobin: 13.9 g/dL (ref 11.1–15.9)
MCH: 30.3 pg (ref 26.6–33.0)
MCHC: 33 g/dL (ref 31.5–35.7)
MCV: 92 fL (ref 79–97)
Platelets: 302 10*3/uL (ref 150–450)
RBC: 4.59 x10E6/uL (ref 3.77–5.28)
RDW: 12.5 % (ref 11.7–15.4)
WBC: 5.4 10*3/uL (ref 3.4–10.8)

## 2021-04-10 LAB — THYROID PANEL WITH TSH
Free Thyroxine Index: 2.1 (ref 1.2–4.9)
T3 Uptake Ratio: 28 % (ref 24–39)
T4, Total: 7.5 ug/dL (ref 4.5–12.0)
TSH: 1.46 u[IU]/mL (ref 0.450–4.500)

## 2021-04-10 LAB — LIPID PANEL
Chol/HDL Ratio: 3.8 ratio (ref 0.0–4.4)
Cholesterol, Total: 181 mg/dL (ref 100–199)
HDL: 48 mg/dL (ref >39)
LDL Chol Calc (NIH): 123 mg/dL — ABNORMAL HIGH (ref 0–99)
Triglycerides: 53 mg/dL (ref 0–149)
VLDL Cholesterol Cal: 10 mg/dL (ref 5–40)

## 2021-09-01 IMAGING — CT CT CHEST W/ CM
2 of 3 series · 15 of 36 positions shown, 18 images · IV contrast (omnipaque)
Comparison: None.

CLINICAL DATA: Right arm swelling

EXAM:
CT CHEST WITH CONTRAST
TECHNIQUE: Multidetector CT imaging of the chest was performed during
intravenous contrast administration.
CONTRAST:  100mL OMNIPAQUE IOHEXOL 300 MG/ML  SOLN

[Series 3: chest with 2mm st · axial · 0.69mm/px · z∈[+1052,+1332]mm · 12 of 166 slices shown, 15 images]
[im 13/166  mediastinal]
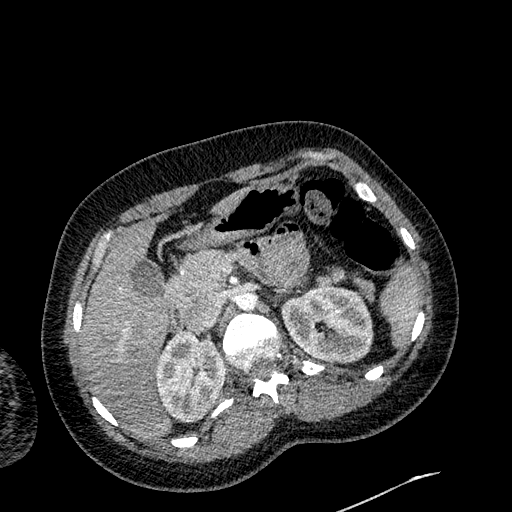
[im 13/166  lung]
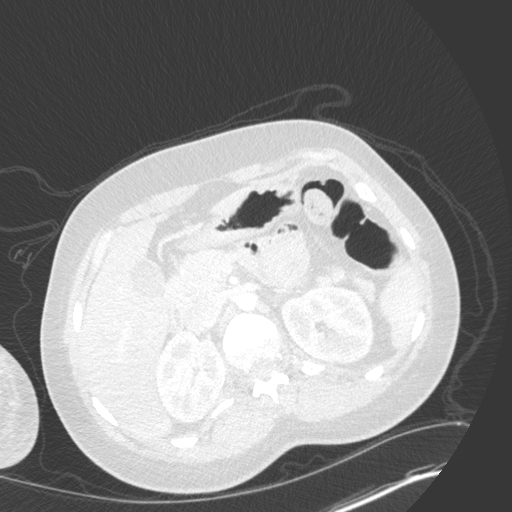
[im 25/166  lung]
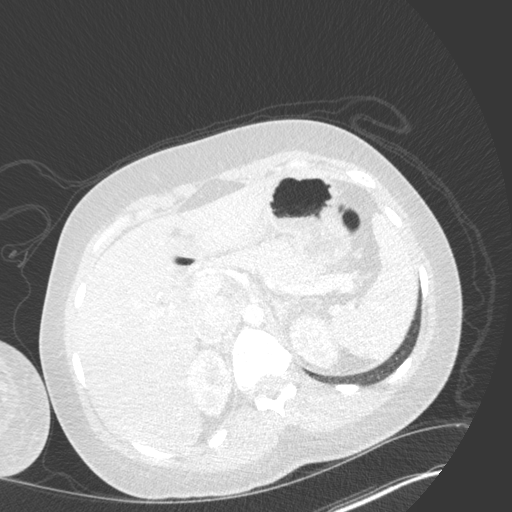
[im 37/166  lung]
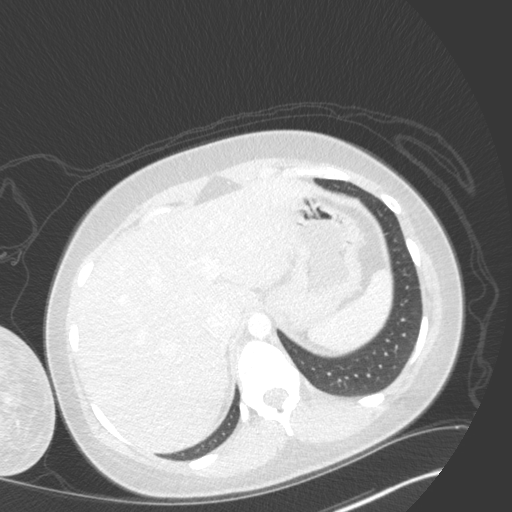
[im 49/166  lung]
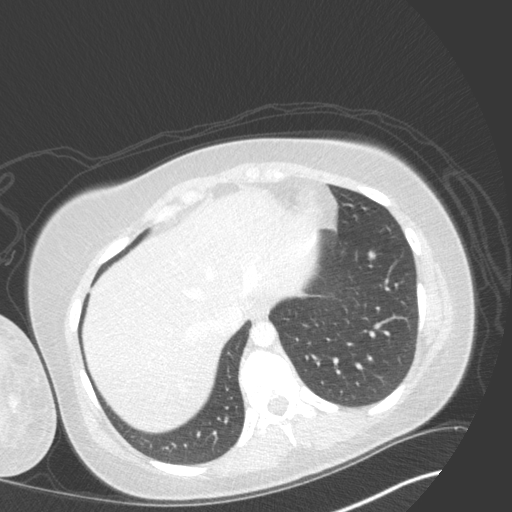
[im 62/166  mediastinal]
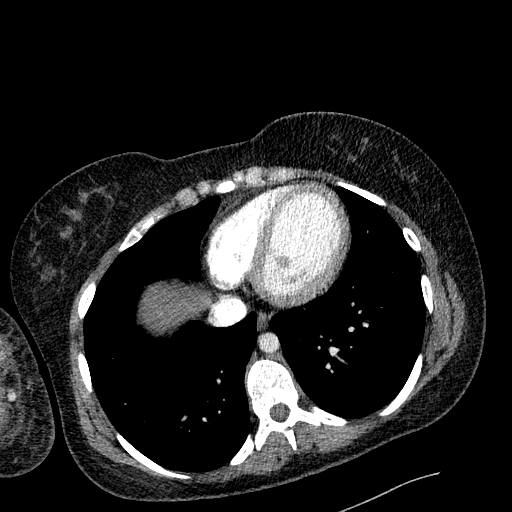
[im 62/166  lung]
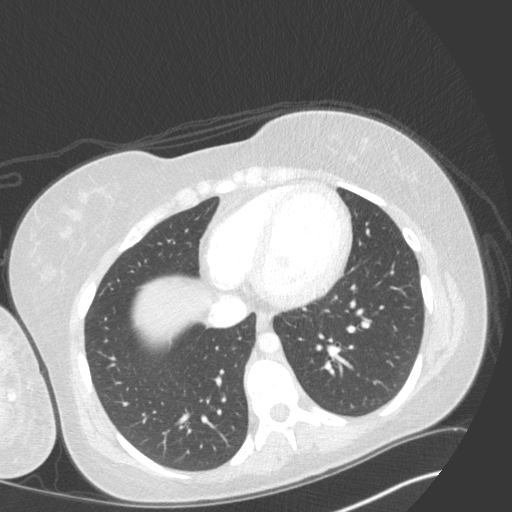
[im 74/166  lung]
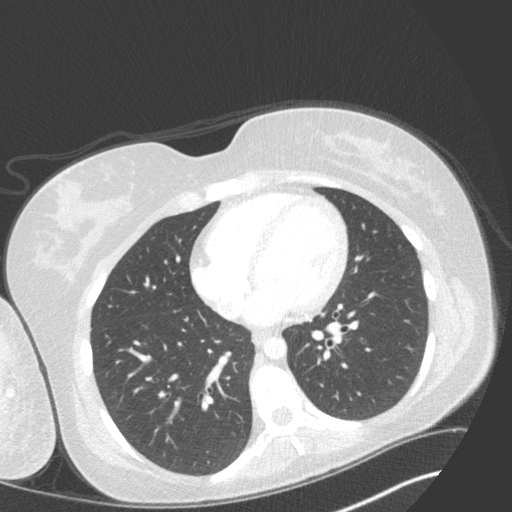
[im 92/166  lung]
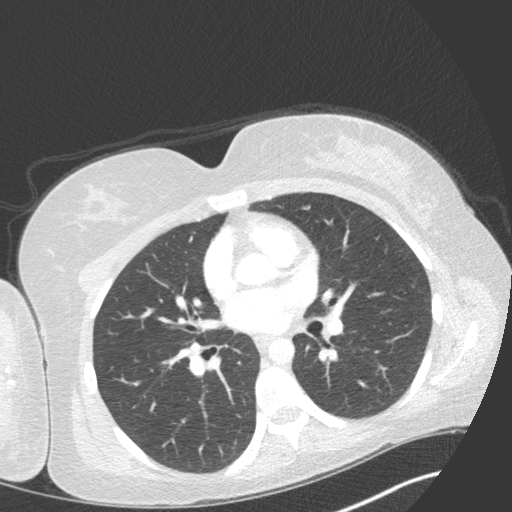
[im 104/166  lung]
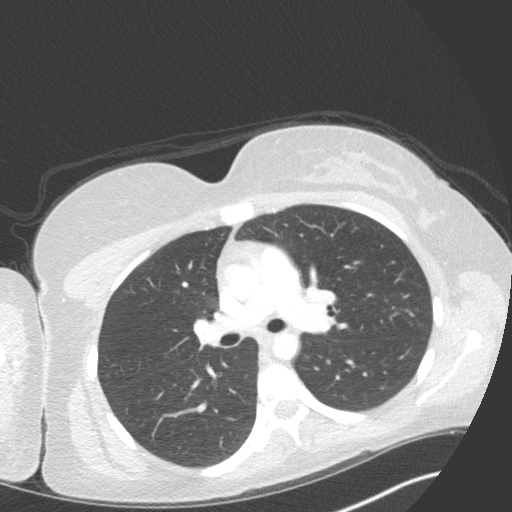
[im 117/166  mediastinal]
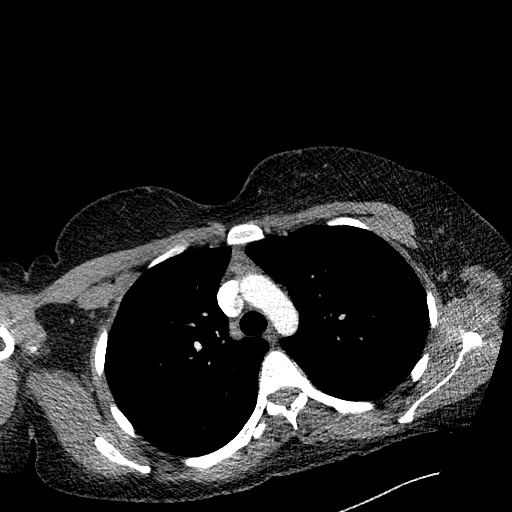
[im 117/166  lung]
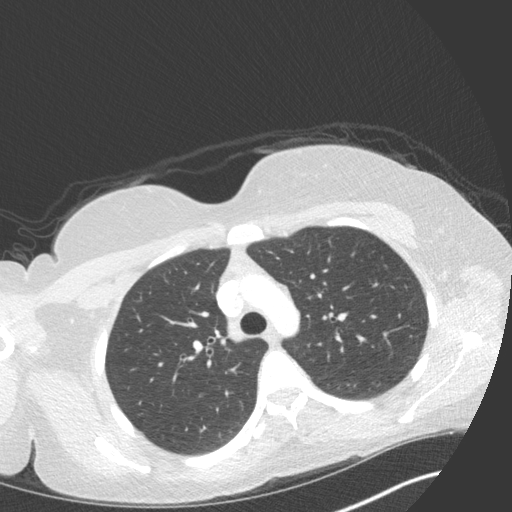
[im 129/166  lung]
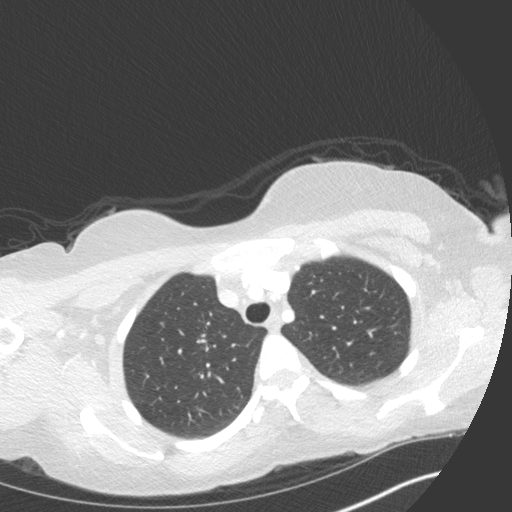
[im 141/166  lung]
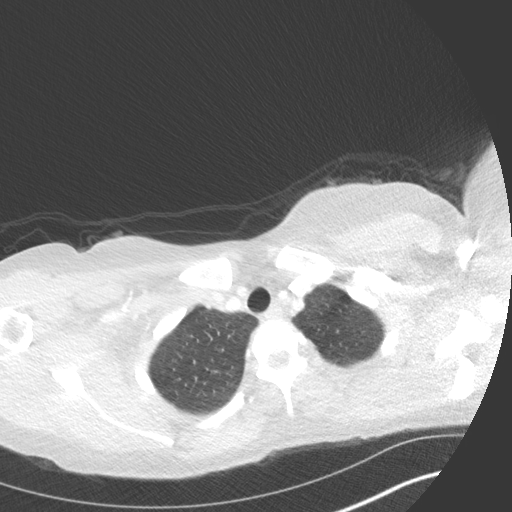
[im 153/166  lung]
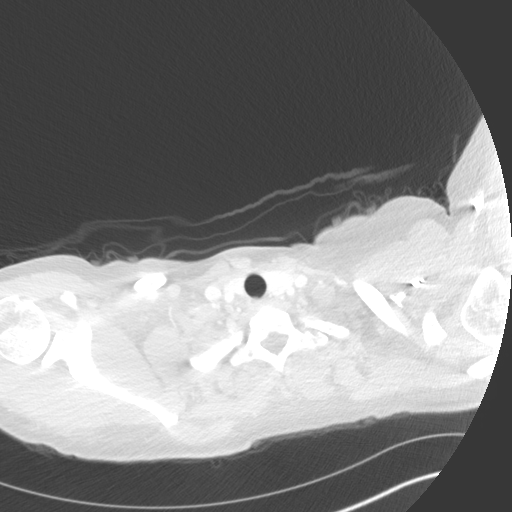

[Series 6: chest with 2mm st cor · coronal · 0.65mm/px · 3 of 151 slices shown]
[im 31/151  lung]
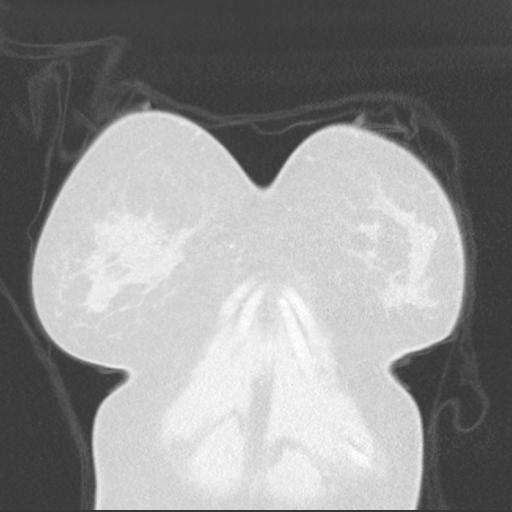
[im 61/151  lung]
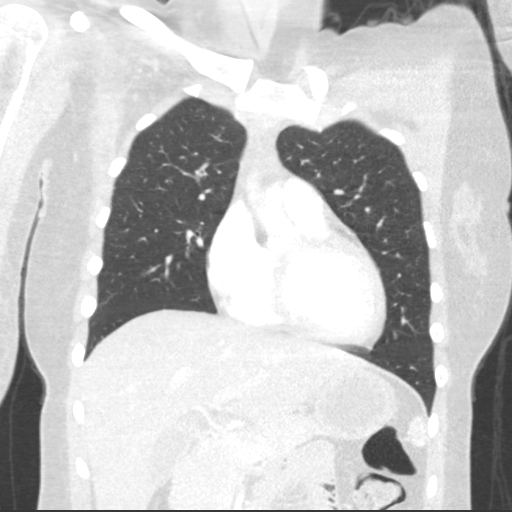
[im 91/151  lung]
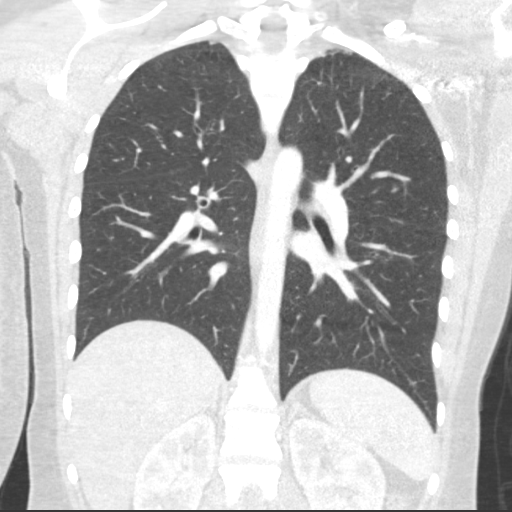

[15 of 36 positions shown; findings below may reference images not displayed]

FINDINGS: Cardiovascular: Right subclavian vein and superior vena cava are not
well evaluated due to contrast timing. Normal heart size. No
pericardial effusion. Normal caliber aorta.

Mediastinum/Nodes: No evidence of adenopathy. The visualized thyroid
is unremarkable. Esophagus is unremarkable.

Lungs/Pleura: No consolidation or mass. No pleural effusion
pneumothorax.

Upper Abdomen: No acute abnormality.

Musculoskeletal: No acute abnormality. Small thoracic spine
Schmorl's nodes.
IMPRESSION: No acute or significant abnormality. The right subclavian vein and
superior vena cava are not well evaluated due to contrast timing.
